# Patient Record
Sex: Male | Born: 1955 | Race: Black or African American | Hispanic: No | Marital: Single | Smoking: Never smoker
Health system: Southern US, Community
[De-identification: ages and names within clinical notes are randomized; demographics above are authoritative.]

## PROBLEM LIST (undated history)

## (undated) DIAGNOSIS — I10 Essential (primary) hypertension: Secondary | ICD-10-CM

## (undated) DIAGNOSIS — E119 Type 2 diabetes mellitus without complications: Secondary | ICD-10-CM

## (undated) HISTORY — PX: BELOW KNEE LEG AMPUTATION: SUR23

## (undated) HISTORY — PX: HERNIA REPAIR: SHX51

---

## 2011-08-21 ENCOUNTER — Encounter (INDEPENDENT_AMBULATORY_CARE_PROVIDER_SITE_OTHER): Payer: Medicaid Other | Admitting: Ophthalmology

## 2011-08-21 DIAGNOSIS — E11319 Type 2 diabetes mellitus with unspecified diabetic retinopathy without macular edema: Secondary | ICD-10-CM

## 2011-08-21 DIAGNOSIS — H43819 Vitreous degeneration, unspecified eye: Secondary | ICD-10-CM

## 2011-08-21 DIAGNOSIS — H2510 Age-related nuclear cataract, unspecified eye: Secondary | ICD-10-CM

## 2011-08-21 DIAGNOSIS — H35039 Hypertensive retinopathy, unspecified eye: Secondary | ICD-10-CM

## 2011-08-21 DIAGNOSIS — I10 Essential (primary) hypertension: Secondary | ICD-10-CM

## 2011-08-21 DIAGNOSIS — E1139 Type 2 diabetes mellitus with other diabetic ophthalmic complication: Secondary | ICD-10-CM

## 2012-08-21 ENCOUNTER — Ambulatory Visit (INDEPENDENT_AMBULATORY_CARE_PROVIDER_SITE_OTHER): Payer: Self-pay | Admitting: Ophthalmology

## 2012-10-01 ENCOUNTER — Ambulatory Visit (INDEPENDENT_AMBULATORY_CARE_PROVIDER_SITE_OTHER): Payer: Medicaid Other | Admitting: Ophthalmology

## 2012-10-01 DIAGNOSIS — E1139 Type 2 diabetes mellitus with other diabetic ophthalmic complication: Secondary | ICD-10-CM

## 2012-10-01 DIAGNOSIS — H2510 Age-related nuclear cataract, unspecified eye: Secondary | ICD-10-CM

## 2012-10-01 DIAGNOSIS — H35039 Hypertensive retinopathy, unspecified eye: Secondary | ICD-10-CM

## 2012-10-01 DIAGNOSIS — H43819 Vitreous degeneration, unspecified eye: Secondary | ICD-10-CM

## 2012-10-01 DIAGNOSIS — E11319 Type 2 diabetes mellitus with unspecified diabetic retinopathy without macular edema: Secondary | ICD-10-CM

## 2012-10-01 DIAGNOSIS — I10 Essential (primary) hypertension: Secondary | ICD-10-CM

## 2013-10-11 ENCOUNTER — Ambulatory Visit (INDEPENDENT_AMBULATORY_CARE_PROVIDER_SITE_OTHER): Payer: Medicaid Other | Admitting: Ophthalmology

## 2013-10-27 ENCOUNTER — Ambulatory Visit (INDEPENDENT_AMBULATORY_CARE_PROVIDER_SITE_OTHER): Payer: Medicaid Other | Admitting: Ophthalmology

## 2013-10-27 DIAGNOSIS — H2513 Age-related nuclear cataract, bilateral: Secondary | ICD-10-CM

## 2013-10-27 DIAGNOSIS — H43813 Vitreous degeneration, bilateral: Secondary | ICD-10-CM

## 2013-10-27 DIAGNOSIS — I10 Essential (primary) hypertension: Secondary | ICD-10-CM

## 2013-10-27 DIAGNOSIS — H35033 Hypertensive retinopathy, bilateral: Secondary | ICD-10-CM

## 2013-10-27 DIAGNOSIS — E11319 Type 2 diabetes mellitus with unspecified diabetic retinopathy without macular edema: Secondary | ICD-10-CM

## 2014-10-14 ENCOUNTER — Emergency Department (HOSPITAL_BASED_OUTPATIENT_CLINIC_OR_DEPARTMENT_OTHER): Payer: No Typology Code available for payment source

## 2014-10-14 ENCOUNTER — Encounter (HOSPITAL_BASED_OUTPATIENT_CLINIC_OR_DEPARTMENT_OTHER): Payer: Self-pay | Admitting: Emergency Medicine

## 2014-10-14 ENCOUNTER — Emergency Department (HOSPITAL_BASED_OUTPATIENT_CLINIC_OR_DEPARTMENT_OTHER)
Admission: EM | Admit: 2014-10-14 | Discharge: 2014-10-14 | Disposition: A | Discharge status: Home / Self Care | Payer: No Typology Code available for payment source | Attending: Emergency Medicine | Admitting: Emergency Medicine

## 2014-10-14 DIAGNOSIS — Y9389 Activity, other specified: Secondary | ICD-10-CM | POA: Diagnosis not present

## 2014-10-14 DIAGNOSIS — I10 Essential (primary) hypertension: Secondary | ICD-10-CM | POA: Diagnosis not present

## 2014-10-14 DIAGNOSIS — S299XXA Unspecified injury of thorax, initial encounter: Secondary | ICD-10-CM | POA: Insufficient documentation

## 2014-10-14 DIAGNOSIS — Z79899 Other long term (current) drug therapy: Secondary | ICD-10-CM | POA: Insufficient documentation

## 2014-10-14 DIAGNOSIS — S4991XA Unspecified injury of right shoulder and upper arm, initial encounter: Secondary | ICD-10-CM | POA: Diagnosis not present

## 2014-10-14 DIAGNOSIS — Z880 Allergy status to penicillin: Secondary | ICD-10-CM | POA: Insufficient documentation

## 2014-10-14 DIAGNOSIS — E119 Type 2 diabetes mellitus without complications: Secondary | ICD-10-CM | POA: Insufficient documentation

## 2014-10-14 DIAGNOSIS — Y92410 Unspecified street and highway as the place of occurrence of the external cause: Secondary | ICD-10-CM | POA: Diagnosis not present

## 2014-10-14 DIAGNOSIS — Y998 Other external cause status: Secondary | ICD-10-CM | POA: Insufficient documentation

## 2014-10-14 DIAGNOSIS — S8991XA Unspecified injury of right lower leg, initial encounter: Secondary | ICD-10-CM | POA: Insufficient documentation

## 2014-10-14 DIAGNOSIS — Z7984 Long term (current) use of oral hypoglycemic drugs: Secondary | ICD-10-CM | POA: Diagnosis not present

## 2014-10-14 HISTORY — DX: Type 2 diabetes mellitus without complications: E11.9

## 2014-10-14 HISTORY — DX: Essential (primary) hypertension: I10

## 2014-10-14 MED ORDER — NAPROXEN 250 MG PO TABS
500.0000 mg | ORAL_TABLET | Freq: Once | ORAL | Status: AC
  Administered 2014-10-14: 500 mg via ORAL
  Filled 2014-10-14: qty 2

## 2014-10-14 MED ORDER — CYCLOBENZAPRINE HCL 10 MG PO TABS
10.0000 mg | ORAL_TABLET | Freq: Once | ORAL | Status: AC
  Administered 2014-10-14: 10 mg via ORAL
  Filled 2014-10-14: qty 1

## 2014-10-14 MED ORDER — NAPROXEN 375 MG PO TABS: 375.0000 mg | ORAL_TABLET | Freq: Two times a day (BID) | ORAL | Status: AC

## 2014-10-14 MED ORDER — CYCLOBENZAPRINE HCL 10 MG PO TABS: 10.0000 mg | ORAL_TABLET | Freq: Two times a day (BID) | ORAL | Status: AC | PRN

## 2014-10-14 NOTE — Discharge Instructions (Signed)

## 2014-10-14 NOTE — ED Notes (Signed)
Patient in XR.
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
## 2014-11-23 ENCOUNTER — Ambulatory Visit (INDEPENDENT_AMBULATORY_CARE_PROVIDER_SITE_OTHER): Payer: Medicaid Other | Admitting: Ophthalmology

## 2014-12-07 ENCOUNTER — Ambulatory Visit (INDEPENDENT_AMBULATORY_CARE_PROVIDER_SITE_OTHER): Payer: Medicaid Other | Admitting: Ophthalmology

## 2014-12-22 ENCOUNTER — Ambulatory Visit (INDEPENDENT_AMBULATORY_CARE_PROVIDER_SITE_OTHER): Payer: Medicaid Other | Admitting: Ophthalmology

## 2015-01-26 ENCOUNTER — Ambulatory Visit (INDEPENDENT_AMBULATORY_CARE_PROVIDER_SITE_OTHER): Payer: Medicaid Other | Admitting: Ophthalmology

## 2015-01-26 DIAGNOSIS — H43813 Vitreous degeneration, bilateral: Secondary | ICD-10-CM

## 2015-01-26 DIAGNOSIS — H2513 Age-related nuclear cataract, bilateral: Secondary | ICD-10-CM | POA: Diagnosis not present

## 2015-01-26 DIAGNOSIS — H35033 Hypertensive retinopathy, bilateral: Secondary | ICD-10-CM

## 2015-01-26 DIAGNOSIS — E113293 Type 2 diabetes mellitus with mild nonproliferative diabetic retinopathy without macular edema, bilateral: Secondary | ICD-10-CM | POA: Diagnosis not present

## 2015-01-26 DIAGNOSIS — E11319 Type 2 diabetes mellitus with unspecified diabetic retinopathy without macular edema: Secondary | ICD-10-CM

## 2015-01-26 DIAGNOSIS — I10 Essential (primary) hypertension: Secondary | ICD-10-CM

## 2016-01-26 ENCOUNTER — Ambulatory Visit (INDEPENDENT_AMBULATORY_CARE_PROVIDER_SITE_OTHER): Payer: Medicaid Other | Admitting: Ophthalmology

## 2016-03-14 ENCOUNTER — Ambulatory Visit (INDEPENDENT_AMBULATORY_CARE_PROVIDER_SITE_OTHER): Payer: Medicaid Other | Admitting: Ophthalmology

## 2020-08-16 ENCOUNTER — Encounter (HOSPITAL_BASED_OUTPATIENT_CLINIC_OR_DEPARTMENT_OTHER): Payer: Self-pay | Admitting: *Deleted

## 2020-08-16 ENCOUNTER — Emergency Department (HOSPITAL_BASED_OUTPATIENT_CLINIC_OR_DEPARTMENT_OTHER): Payer: Medicare Other

## 2020-08-16 ENCOUNTER — Emergency Department (HOSPITAL_BASED_OUTPATIENT_CLINIC_OR_DEPARTMENT_OTHER)
Admission: EM | Admit: 2020-08-16 | Discharge: 2020-08-16 | Disposition: A | Discharge status: Home / Self Care | Payer: Medicare Other | Attending: Emergency Medicine | Admitting: Emergency Medicine

## 2020-08-16 ENCOUNTER — Other Ambulatory Visit: Payer: Self-pay

## 2020-08-16 DIAGNOSIS — Z7984 Long term (current) use of oral hypoglycemic drugs: Secondary | ICD-10-CM | POA: Diagnosis not present

## 2020-08-16 DIAGNOSIS — E119 Type 2 diabetes mellitus without complications: Secondary | ICD-10-CM | POA: Insufficient documentation

## 2020-08-16 DIAGNOSIS — S0292XA Unspecified fracture of facial bones, initial encounter for closed fracture: Secondary | ICD-10-CM

## 2020-08-16 DIAGNOSIS — Z79899 Other long term (current) drug therapy: Secondary | ICD-10-CM | POA: Diagnosis not present

## 2020-08-16 DIAGNOSIS — W19XXXA Unspecified fall, initial encounter: Secondary | ICD-10-CM

## 2020-08-16 DIAGNOSIS — W01198A Fall on same level from slipping, tripping and stumbling with subsequent striking against other object, initial encounter: Secondary | ICD-10-CM | POA: Insufficient documentation

## 2020-08-16 DIAGNOSIS — S0081XA Abrasion of other part of head, initial encounter: Secondary | ICD-10-CM

## 2020-08-16 DIAGNOSIS — I10 Essential (primary) hypertension: Secondary | ICD-10-CM | POA: Insufficient documentation

## 2020-08-16 DIAGNOSIS — Z7901 Long term (current) use of anticoagulants: Secondary | ICD-10-CM | POA: Diagnosis not present

## 2020-08-16 DIAGNOSIS — S02841A Fracture of lateral orbital wall, right side, initial encounter for closed fracture: Secondary | ICD-10-CM | POA: Diagnosis not present

## 2020-08-16 DIAGNOSIS — S0993XA Unspecified injury of face, initial encounter: Secondary | ICD-10-CM | POA: Diagnosis present

## 2020-08-16 MED ORDER — HYDROCODONE-ACETAMINOPHEN 5-325 MG PO TABS: 1.0000 | ORAL_TABLET | ORAL | 0 refills | Status: AC | PRN

## 2020-08-16 NOTE — ED Triage Notes (Signed)
Pt reports fall x 1 day ago from standing hitting head on pavement, lac to eyebrow and abrasion to right temple area, redness to outer corner of right eye , pt is on xarelto

## 2020-08-16 NOTE — ED Notes (Signed)
Patient transported to CT 

## 2020-08-16 NOTE — Discharge Instructions (Addendum)
Follow up with Facial surgeon for recheck

## 2020-08-16 NOTE — ED Provider Notes (Signed)
[facility] EMERGENCY DEPARTMENT Provider Note   CSN: [id number] Arrival date & time: 08/16/20  1659     History Chief Complaint  Patient presents with   Head Injury    Nathaniel Mccullough is a 65 y.o. male.  Pt fell yesterday and hit his face.  Pt saw his MD today and was told to come in.  Pt is on xarelto.  Pt reports no loc   The history is provided by the patient. No language interpreter was used.  Head Injury Location:  Frontal Time since incident:  1 day Mechanism of injury: fall   Fall:    Point of impact:  Face   Entrapped after fall: no   Pain details:    Radiates to:  Face   Severity:  Moderate   Timing:  Constant Relieved by:  Nothing Worsened by:  Nothing Ineffective treatments:  None tried Associated symptoms: no blurred vision, no focal weakness, no headaches and no nausea       Past Medical History:  Diagnosis Date   Diabetes mellitus without complication (HCC)    Hypertension     There are no problems to display for this patient.   Past Surgical History:  Procedure Laterality Date   BELOW KNEE LEG AMPUTATION     HERNIA REPAIR         No family history on file.  Social History   Tobacco Use   Smoking status: Never  Substance Use Topics   Alcohol use: No    Home Medications Prior to Admission medications   Medication Sig Start Date End Date Taking? Authorizing Provider  HYDROcodone-acetaminophen (NORCO/VICODIN) 5-325 MG tablet Take 1 tablet by mouth every 4 (four) hours as needed for moderate pain. 08/16/20 08/16/21 Yes Cheron Schaumann K, PA-C  cyclobenzaprine (FLEXERIL) 10 MG tablet Take 1 tablet (10 mg total) by mouth 2 (two) times daily as needed for muscle spasms. 10/14/14   Gwyneth Sprout, MD  lisinopril (PRINIVIL,ZESTRIL) 40 MG tablet Take 40 mg by mouth daily.    [provider]  metFORMIN (GLUCOPHAGE) 1000 MG tablet Take 500 mg by mouth 2 (two) times daily with a meal.    [provider]  naproxen  (NAPROSYN) 375 MG tablet Take 1 tablet (375 mg total) by mouth 2 (two) times daily. 10/14/14   Gwyneth Sprout, MD    Allergies    Iodine, Penicillins, and Shellfish allergy  Review of Systems   Review of Systems  Eyes:  Negative for blurred vision.  Gastrointestinal:  Negative for nausea.  Skin:  Positive for wound.  Neurological:  Negative for focal weakness and headaches.  All other systems reviewed and are negative.  Physical Exam Updated Vital Signs BP (!) 201/90   Pulse (!) 51   Temp 98.6 F (37 C) (Oral)   Resp 20   Ht 5\' 10"  (1.778 m)   Wt 65.8 kg   SpO2 96%   BMI 20.81 kg/m   Physical Exam Vitals and nursing note reviewed.  Constitutional:      Appearance: He is well-developed.  HENT:     Head: Normocephalic.     Comments: Abrasion right forehead, abrasion right eyebrow     Nose: Nose normal.  Eyes:     Extraocular Movements: Extraocular movements intact.     Conjunctiva/sclera: Conjunctivae normal.     Pupils: Pupils are equal, round, and reactive to light.     Comments: Subconjunctival hemorrhage right eye. EOMI  no entrapment  Cardiovascular:  Rate and Rhythm: Normal rate.  Pulmonary:     Effort: Pulmonary effort is normal.  Abdominal:     General: There is no distension.  Musculoskeletal:        General: Normal range of motion.     Cervical back: Normal range of motion.  Neurological:     General: No focal deficit present.     Mental Status: He is alert and oriented to person, place, and time. Mental status is at baseline.  Psychiatric:        Mood and Affect: Mood normal.    ED Results / Procedures / Treatments   Labs (all labs ordered are listed, but only abnormal results are displayed) Labs Reviewed - No data to display  EKG None  Radiology CT HEAD WO CONTRAST ( )  Result Date: 08/16/2020 CLINICAL DATA:  Head trauma EXAM: CT HEAD WITHOUT CONTRAST TECHNIQUE: Contiguous axial images were obtained from the base of the skull through  the vertex without intravenous contrast. COMPARISON:  None. FINDINGS: Brain: No acute territorial infarction, hemorrhage or intracranial mass. Mild atrophy. Nonenlarged ventricles Vascular: No hyperdense vessels. Scattered carotid vascular calcification Skull: No depressed skull fracture. Sinuses/Orbits: Acute nondisplaced right lateral orbital wall fracture. Findings suspicious for nondisplaced right orbital roof fracture on coronal images. Acute mildly displaced right zygomatic arch fracture. Incompletely visualized mildly comminuted fractures involving the right anterior and posterolateral wall of the maxillary sinus. Acute fluid level in the right maxillary sinus. Lobulated mucosal thickening left maxillary sinus and sphenoid sinuses. Other: Right facial and forehead soft tissue swelling IMPRESSION: 1. No definite CT evidence for acute intracranial abnormality. Atrophy 2. Multiple right facial bone fractures incompletely visualized. There are fractures involving the right orbit, right zygomatic arch, and right maxillary sinus. Electronically Signed   By: Jasmine Pang M.D.   On: 08/16/2020 17:40    Procedures Procedures   Medications Ordered in ED Medications - No data to display  ED Course  I have reviewed the triage vital signs and the nursing notes.  Pertinent labs & imaging results that were available during my care of the patient were reviewed by me and considered in my medical decision making (see chart for details).    MDM Rules/Calculators/A&P                           MDM:  Pt counseled on results.  Pt given rx for hydrocodone  Pt advised to follow up with Ent for recheck in 1 week  Final Clinical Impression(s) / ED Diagnoses Final diagnoses:  Facial fracture due to fall, closed, initial encounter (HCC)  Facial abrasion, initial encounter    Rx / DC Orders ED Discharge Orders          Ordered    HYDROcodone-acetaminophen (NORCO/VICODIN) 5-325 MG tablet  Every 4 hours PRN         08/16/20 1849          An After Visit Summary was printed and given to the patient.    Osie Cheeks 08/16/20 2315    Cheryll Cockayne, MD 08/19/20 [phone]
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
## 2020-08-16 NOTE — ED Notes (Signed)
Pt teaching provided on medications that may cause drowsiness. Pt instructed not to drive or operate heavy machinery while taking the prescribed medication. Pt verbalized understanding.  ? ?Pt provided discharge instructions and prescription information. Pt was given the opportunity to ask questions and questions were answered. Discharge signature not obtained in the setting of the COVID-19 pandemic in order to reduce high touch surfaces.  ? ?

## 2022-01-06 ENCOUNTER — Other Ambulatory Visit: Payer: Self-pay

## 2022-01-06 ENCOUNTER — Emergency Department (HOSPITAL_BASED_OUTPATIENT_CLINIC_OR_DEPARTMENT_OTHER)
Admission: EM | Admit: 2022-01-06 | Discharge: 2022-01-06 | Disposition: A | Discharge status: Home / Self Care | Payer: Medicare Other | Attending: Emergency Medicine | Admitting: Emergency Medicine

## 2022-01-06 ENCOUNTER — Encounter (HOSPITAL_BASED_OUTPATIENT_CLINIC_OR_DEPARTMENT_OTHER): Payer: Self-pay

## 2022-01-06 DIAGNOSIS — B0229 Other postherpetic nervous system involvement: Secondary | ICD-10-CM | POA: Insufficient documentation

## 2022-01-06 MED ORDER — HYDROCODONE-ACETAMINOPHEN 5-325 MG PO TABS: 2.0000 | ORAL_TABLET | ORAL | 0 refills | Status: DC | PRN

## 2022-01-06 NOTE — Discharge Instructions (Addendum)
Contact a health care provider if: Your medicine is not helping. You are struggling to manage your pain at home. Get help right away if: You have thoughts about hurting yourself or others. If you ever feel like you may hurt yourself or others, or have thoughts about taking your own life, get help right away. Go to your nearest emergency department or: Call your local emergency services (911 in the [place] Call a suicide crisis helpline, such as the National Suicide Prevention Lifeline at 9548155501 or 988 in the [place] This is open 24 hours a day in the [place] Text the Crisis Text Line at 5743653745 (in the [place]

## 2022-01-06 NOTE — ED Provider Notes (Signed)
[facility] EMERGENCY DEPARTMENT Provider Note   CSN: [id number] Arrival date & time: 01/06/22  2024     History  Chief Complaint  Patient presents with   Herpes Zoster    Nathaniel Mccullough is a 66 y.o. male nerve pain. Recent dx shingles. Now having post herpetic neurlagia. States " I just want to get some sleep. Plans to see PCP later this week. Deneis si/sx cellulitits  HPI     Home Medications Prior to Admission medications   Medication Sig Start Date End Date Taking? Authorizing Provider  cyclobenzaprine (FLEXERIL) 10 MG tablet Take 1 tablet (10 mg total) by mouth 2 (two) times daily as needed for muscle spasms. 10/14/14   Gwyneth Sprout, MD  lisinopril (PRINIVIL,ZESTRIL) 40 MG tablet Take 40 mg by mouth daily.    [provider]  metFORMIN (GLUCOPHAGE) 1000 MG tablet Take 500 mg by mouth 2 (two) times daily with a meal.    [provider]  naproxen (NAPROSYN) 375 MG tablet Take 1 tablet (375 mg total) by mouth 2 (two) times daily. 10/14/14   Gwyneth Sprout, MD      Allergies    Iodine, Penicillins, and Shellfish allergy    Review of Systems   Review of Systems  Physical Exam Updated Vital Signs BP (!) 211/75 (BP Location: Left Arm)   Pulse 61   Temp 98.2 F (36.8 C) (Oral)   Resp 14   Ht 5\' 9"  (1.753 m)   Wt 59 kg   SpO2 100%   BMI 19.20 kg/m  Physical Exam Vitals and nursing note reviewed.  Constitutional:      General: He is not in acute distress.    Appearance: He is well-developed. He is not diaphoretic.  HENT:     Head: Normocephalic and atraumatic.  Eyes:     General: No scleral icterus.    Conjunctiva/sclera: Conjunctivae normal.  Cardiovascular:     Rate and Rhythm: Normal rate and regular rhythm.     Heart sounds: Normal heart sounds.  Pulmonary:     Effort: Pulmonary effort is normal. No respiratory distress.     Breath sounds: Normal breath sounds.  Abdominal:     Palpations: Abdomen is soft.      Tenderness: There is no abdominal tenderness.  Musculoskeletal:     Cervical back: Normal range of motion and neck supple.  Skin:    General: Skin is warm and dry.  Neurological:     Mental Status: He is alert.  Psychiatric:        Behavior: Behavior normal.     ED Results / Procedures / Treatments   Labs (all labs ordered are listed, but only abnormal results are displayed) Labs Reviewed - No data to display  EKG None  Radiology No results found.  Procedures Procedures    Medications Ordered in ED Medications - No data to display  ED Course/ Medical Decision Making/ A&P                           Medical Decision Making PDMP reviewed during this encounter. Patient here with postherpetic neuralgia.  Blood pressure is elevated.  I he thinks is likely due to his pain level.  He takes blood pressure medications.  He has no chest pain or shortness of breath.  Patient is advised that he will need to follow closely with his primary care physician.  I have discharge patient with a very short  course of Norco.  He understands risks of taking an opiate medication.  He presented was appropriate for discharge without evidence of secondary cellulitis.  Risk Prescription drug management.           Final Clinical Impression(s) / ED Diagnoses Final diagnoses:  None    Rx / DC Orders ED Discharge Orders     None         Arthor Captain, PA-C 01/06/22 2330    Mesner, Barbara Cower, MD 01/08/22 [phone]

## 2022-01-06 NOTE — ED Triage Notes (Signed)
Pt c/o pain from shingles on back. States he was diagnosed with shingles 3 weeks ago, pt states he took the meds that were prescribed, but is still having pain.

## 2022-01-07 ENCOUNTER — Telehealth (HOSPITAL_BASED_OUTPATIENT_CLINIC_OR_DEPARTMENT_OTHER): Payer: Self-pay | Admitting: Emergency Medicine

## 2022-01-07 MED ORDER — HYDROCODONE-ACETAMINOPHEN 5-325 MG PO TABS: 1.0000 | ORAL_TABLET | Freq: Four times a day (QID) | ORAL | 0 refills | Status: AC | PRN

## 2022-01-07 NOTE — Telephone Encounter (Signed)
I saw this patient yesterday evening for complaint of postherpetic neuralgia.  I ordered medication to his pharmacy for pain and verified throughput however pharmacy states that they did not receive it.  I am sending it a second time.

## 2022-10-23 IMAGING — CT CT HEAD W/O CM
3 series · 15 of 47 positions shown, 18 images · non-contrast
Comparison: None.

CLINICAL DATA: Head trauma

EXAM:
CT HEAD WITHOUT CONTRAST
TECHNIQUE: Contiguous axial images were obtained from the base of the skull
through the vertex without intravenous contrast.

[Series 2: head wo · axial · 0.42mm/px · z∈[+982,+1112]mm · 9 of 32 slices shown, 12 images]
[im 3/32  brain]
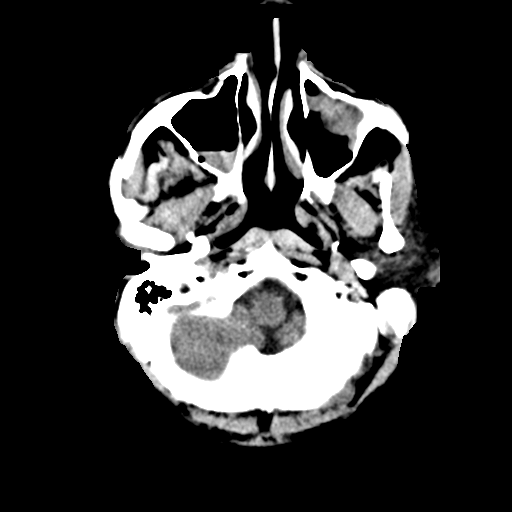
[im 3/32  bone]
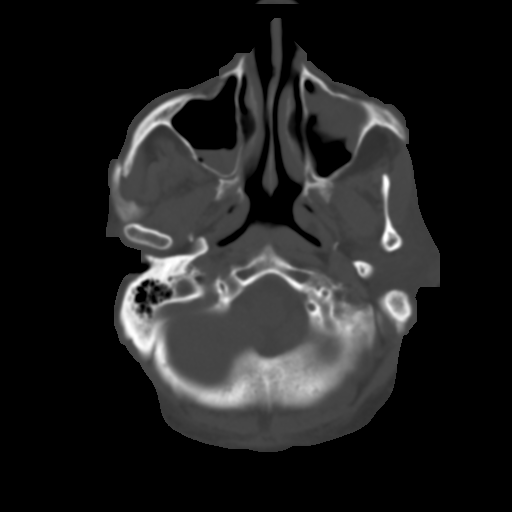
[im 6/32  brain]
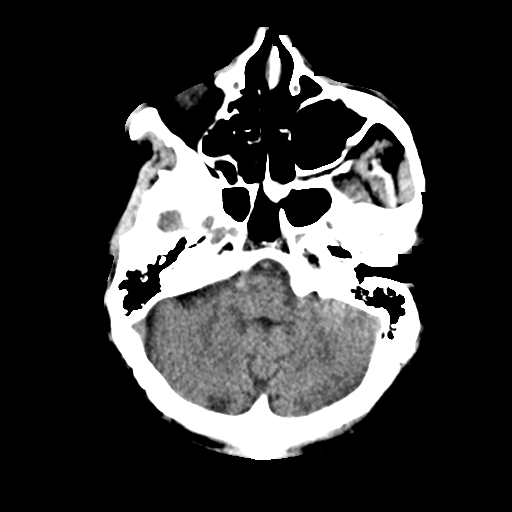
[im 9/32  brain]
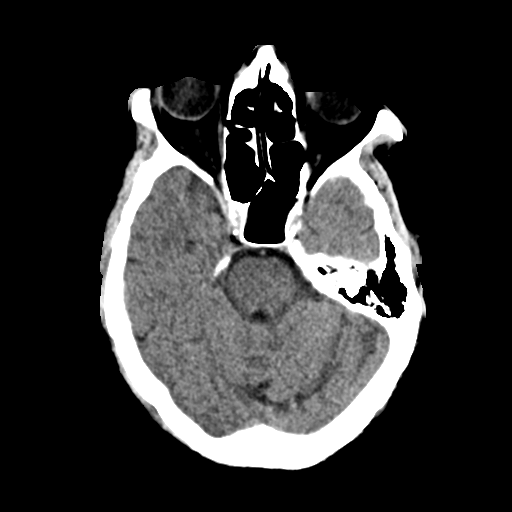
[im 12/32  brain]
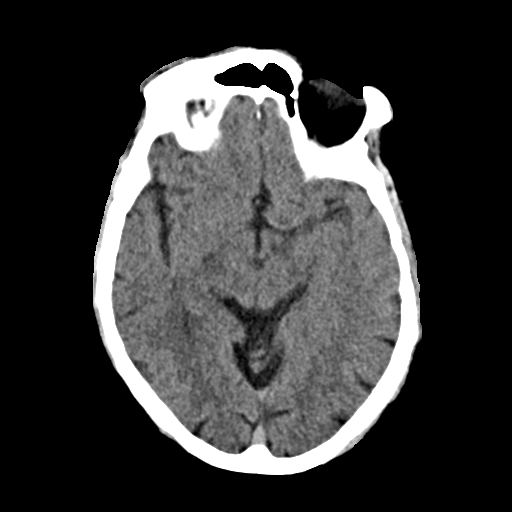
[im 17/32  brain]
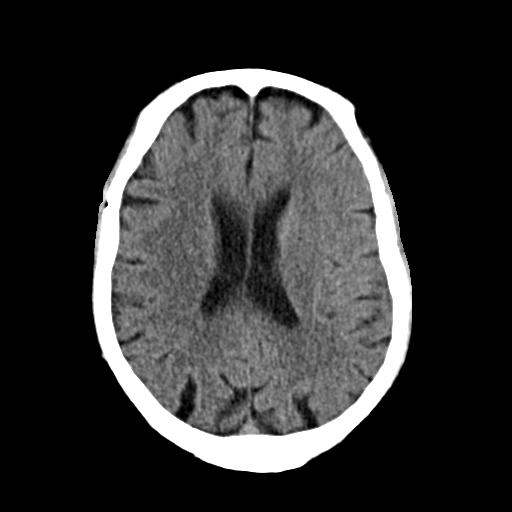
[im 17/32  bone]
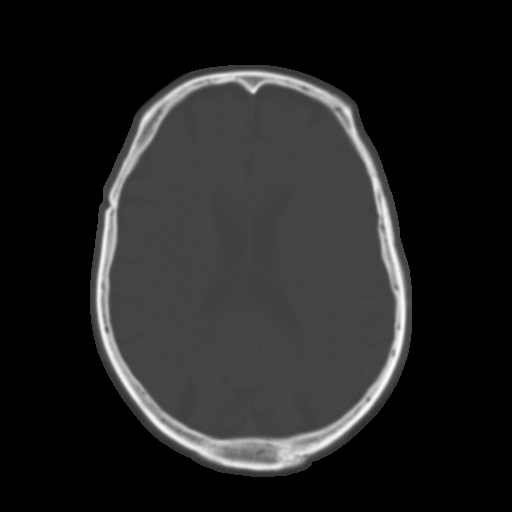
[im 20/32  brain]
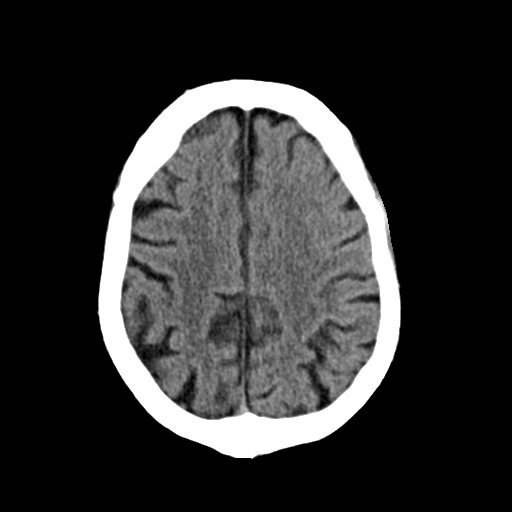
[im 23/32  brain]
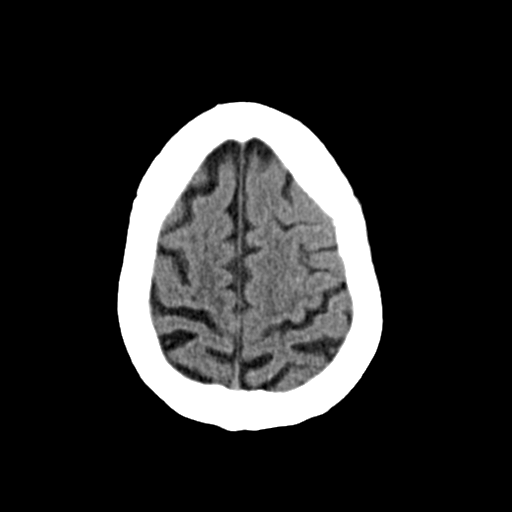
[im 26/32  brain]
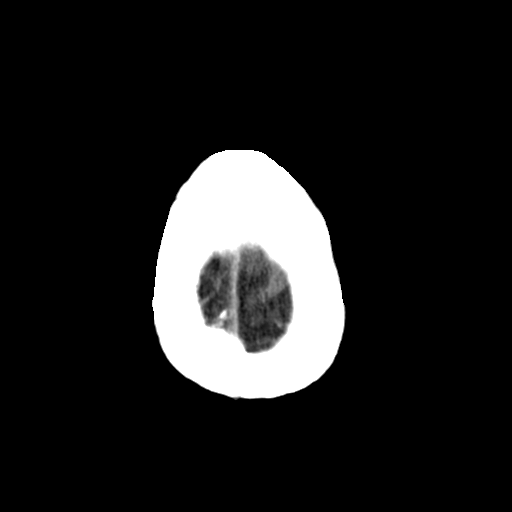
[im 29/32  brain]
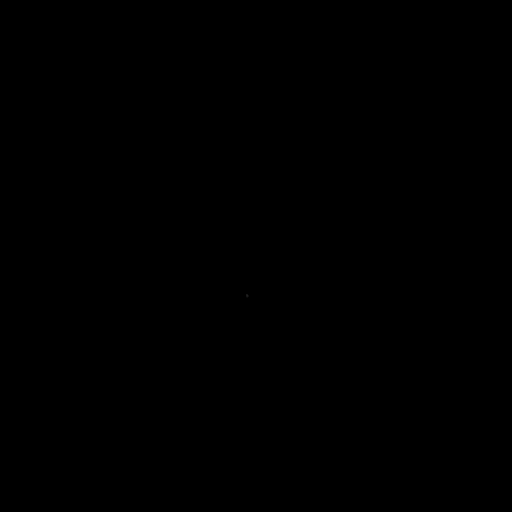
[im 29/32  bone]
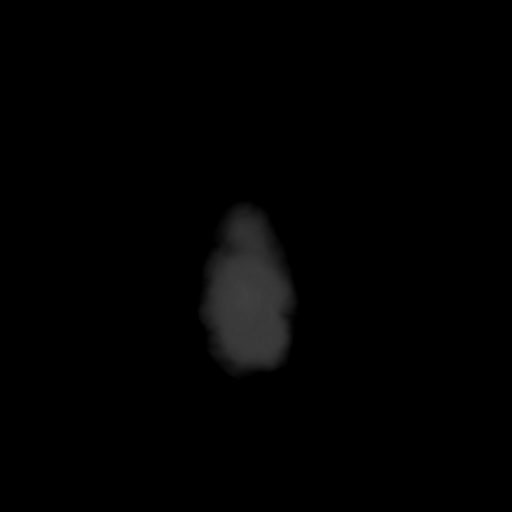

[Series 4: coronal soft · coronal · 0.31mm/px · 3 of 67 slices shown]
[im 23/67  brain]
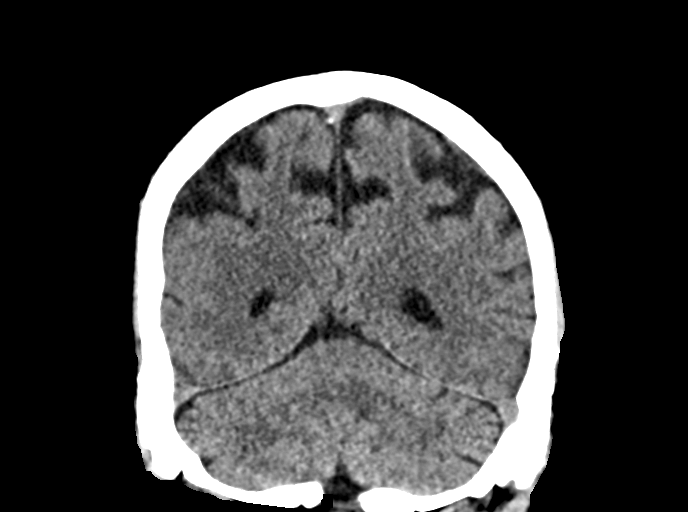
[im 30/67  brain]
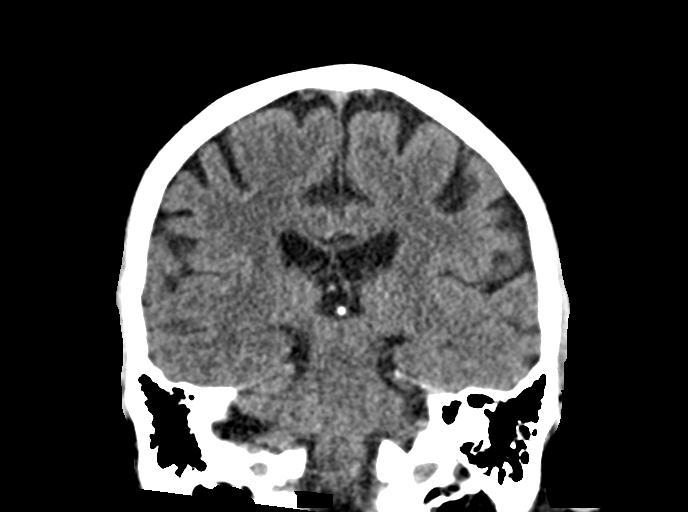
[im 37/67  brain]
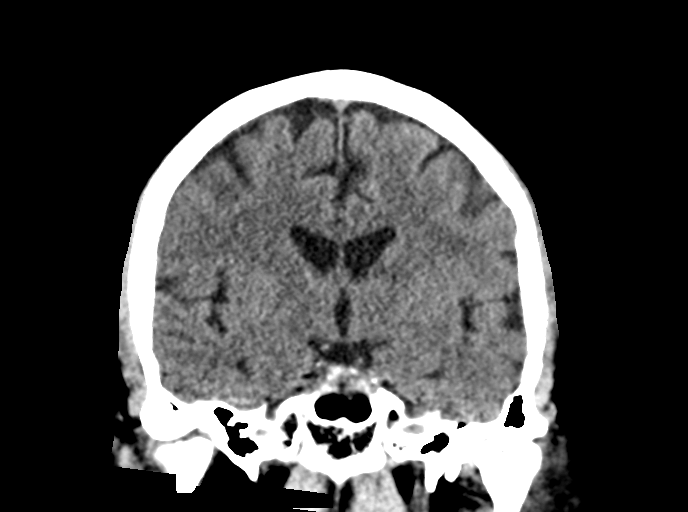

[Series 5: sag soft · sagittal · 0.31mm/px · 3 of 67 slices shown]
[im 26/67  brain]
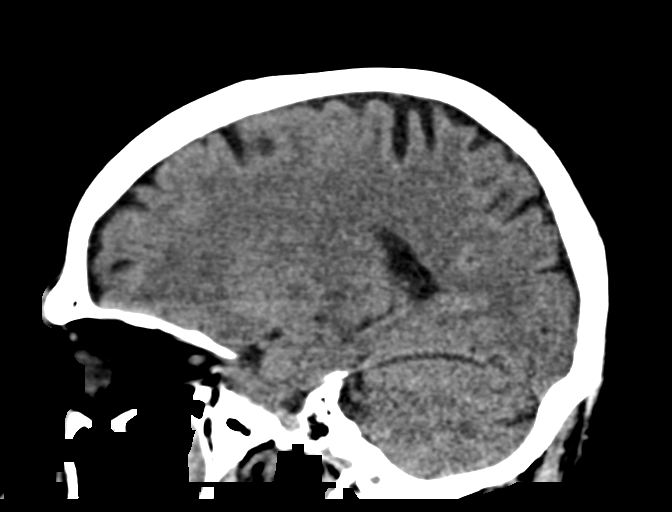
[im 34/67  brain]
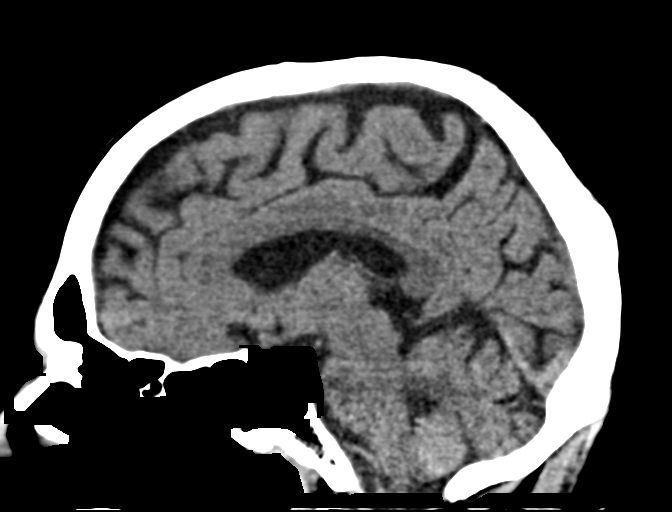
[im 42/67  brain]
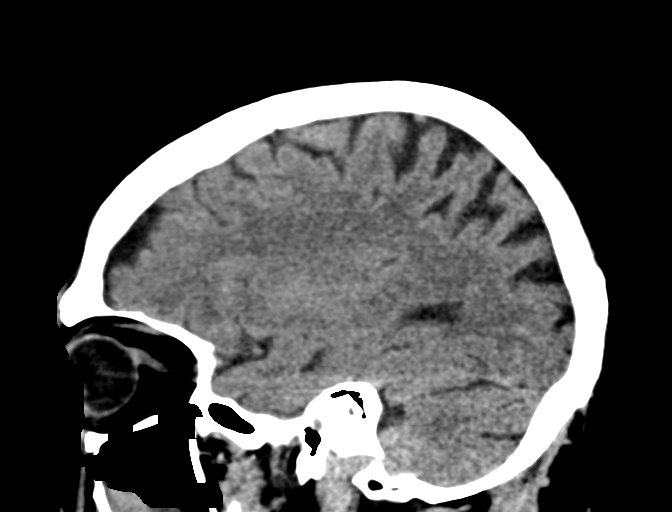

[15 of 47 positions shown; findings below may reference images not displayed]

FINDINGS: Brain: No acute territorial infarction, hemorrhage or intracranial
mass. Mild atrophy. Nonenlarged ventricles

Vascular: No hyperdense vessels. Scattered carotid vascular
calcification

Skull: No depressed skull fracture.

Sinuses/Orbits: Acute nondisplaced right lateral orbital wall
fracture. Findings suspicious for nondisplaced right orbital roof
fracture on coronal images. Acute mildly displaced right zygomatic
arch fracture. Incompletely visualized mildly comminuted fractures
involving the right anterior and posterolateral wall of the
maxillary sinus. Acute fluid level in the right maxillary sinus.
Lobulated mucosal thickening left maxillary sinus and sphenoid
sinuses.

Other: Right facial and forehead soft tissue swelling
IMPRESSION: 1. No definite CT evidence for acute intracranial abnormality.
Atrophy
2. Multiple right facial bone fractures incompletely visualized.
There are fractures involving the right orbit, right zygomatic arch,
and right maxillary sinus.

## 2023-02-03 ENCOUNTER — Emergency Department (HOSPITAL_BASED_OUTPATIENT_CLINIC_OR_DEPARTMENT_OTHER)
Admission: EM | Admit: 2023-02-03 | Discharge: 2023-02-03 | Disposition: A | Discharge status: Home / Self Care | Payer: Medicare HMO | Attending: Emergency Medicine | Admitting: Emergency Medicine

## 2023-02-03 ENCOUNTER — Emergency Department (HOSPITAL_BASED_OUTPATIENT_CLINIC_OR_DEPARTMENT_OTHER): Payer: Medicare HMO

## 2023-02-03 ENCOUNTER — Encounter (HOSPITAL_BASED_OUTPATIENT_CLINIC_OR_DEPARTMENT_OTHER): Payer: Self-pay | Admitting: Emergency Medicine

## 2023-02-03 ENCOUNTER — Other Ambulatory Visit: Payer: Self-pay

## 2023-02-03 DIAGNOSIS — I10 Essential (primary) hypertension: Secondary | ICD-10-CM | POA: Diagnosis not present

## 2023-02-03 DIAGNOSIS — Z20822 Contact with and (suspected) exposure to covid-19: Secondary | ICD-10-CM | POA: Insufficient documentation

## 2023-02-03 DIAGNOSIS — E119 Type 2 diabetes mellitus without complications: Secondary | ICD-10-CM | POA: Insufficient documentation

## 2023-02-03 DIAGNOSIS — J101 Influenza due to other identified influenza virus with other respiratory manifestations: Secondary | ICD-10-CM | POA: Diagnosis not present

## 2023-02-03 DIAGNOSIS — R059 Cough, unspecified: Secondary | ICD-10-CM | POA: Diagnosis present

## 2023-02-03 LAB — RESP PANEL BY RT-PCR (RSV, FLU A&B, COVID)  RVPGX2
Influenza A by PCR: POSITIVE — AB
Influenza B by PCR: NEGATIVE
Resp Syncytial Virus by PCR: NEGATIVE
SARS Coronavirus 2 by RT PCR: NEGATIVE

## 2023-02-03 MED ORDER — BENZONATATE 100 MG PO CAPS: 100.0000 mg | ORAL_CAPSULE | Freq: Three times a day (TID) | ORAL | 0 refills | Status: AC | PRN

## 2023-02-03 MED ORDER — FLUTICASONE PROPIONATE 50 MCG/ACT NA SUSP: 2.0000 | Freq: Every day | NASAL | 0 refills | Status: AC

## 2023-02-03 MED ORDER — ALBUTEROL SULFATE HFA 108 (90 BASE) MCG/ACT IN AERS: 1.0000 | INHALATION_SPRAY | Freq: Four times a day (QID) | RESPIRATORY_TRACT | 0 refills | Status: AC | PRN

## 2023-02-03 NOTE — ED Provider Notes (Signed)
Emergency Department Provider Note   I have reviewed the triage vital signs and the nursing notes.   HISTORY  Chief Complaint Cough   HPI Nathaniel Mccullough is a 68 y.o. male who presents to the emergency department valuation of feeling cold with cough, congestion, mildly productive cough.  Symptoms been ongoing for the past 1 to 2 weeks.  No fever.  Denies chest pain or specific shortness of breath.  No known sick contacts.  Has tried over-the-counter medications without significant relief in symptoms.   Past Medical History:  Diagnosis Date   Diabetes mellitus without complication (HCC)    Hypertension     Review of Systems  Constitutional: No fever/chills. Positive rhinorrhea.  Cardiovascular: Denies chest pain. Respiratory: Denies shortness of breath. Positive cough.  Gastrointestinal: No abdominal pain.  No nausea, no vomiting.   Skin: Negative for rash. Neurological: Negative for headaches.  ____________________________________________   PHYSICAL EXAM:  VITAL SIGNS: ED Triage Vitals  Encounter Vitals Group     BP 02/03/23 1135 (!) 171/98     Pulse Rate 02/03/23 1135 100     Resp 02/03/23 1135 19     Temp 02/03/23 1135 99.2 F (37.3 C)     Temp Source 02/03/23 1135 Oral     SpO2 02/03/23 1135 99 %     Weight 02/03/23 1132 147 lb (66.7 kg)     Height 02/03/23 1132 5\' 9"  (1.753 m)   Constitutional: Alert and oriented. Well appearing and in no acute distress. Eyes: Conjunctivae are normal.  Head: Atraumatic. Nose: Positive congestion/rhinnorhea. Mouth/Throat: Mucous membranes are moist.  Oropharynx non-erythematous. Neck: No stridor.   Cardiovascular: Normal rate, regular rhythm. Good peripheral circulation. Grossly normal heart sounds.   Respiratory: Normal respiratory effort.  No retractions. Lungs CTAB. Gastrointestinal: Soft and nontender. No distention.  Musculoskeletal: No gross deformities of extremities. Neurologic:  Normal speech and  language. Skin:  Skin is warm, dry and intact. No rash noted.  ____________________________________________   LABS (all labs ordered are listed, but only abnormal results are displayed)  Labs Reviewed  RESP PANEL BY RT-PCR (RSV, FLU A&B, COVID)  RVPGX2 - Abnormal; Notable for the following components:      Result Value   Influenza A by PCR POSITIVE (*)    All other components within normal limits   ____________________________________________   PROCEDURES  Procedure(s) performed:   Procedures  None  ____________________________________________   INITIAL IMPRESSION / ASSESSMENT AND PLAN / ED COURSE  Pertinent labs & imaging results that were available during my care of the patient were reviewed by me and considered in my medical decision making (see chart for details).   This patient is Presenting for Evaluation of cough, which does require a range of treatment options, and is a complaint that involves a moderate risk of morbidity and mortality.  The Differential Diagnoses include COVID, FLu, RSV, CAP, etc.   Clinical Laboratory Tests Ordered, included flu a positive.  Radiologic Tests Ordered, included CXR. I independently interpreted the images and agree with radiology interpretation.   Cardiac Monitor Tracing which shows NSR.    Social Determinants of Health Risk patient is a non-smoker.   Consult complete with  Medical Decision Making: Summary:  Patient presents to the emergency department for evaluation of cough, congestion, flulike symptoms.  Testing positive for flu a here.  No hypoxemia or increased work of breathing.  Lung sounds are clear.  No evidence of pneumonia on chest x-ray.  Patient outside the window to  benefit from Tamiflu.  Discussed continued supportive care at home with PCP follow-up as needed.   Patient's presentation is most consistent with acute, uncomplicated illness.   Disposition:  discharge  ____________________________________________  FINAL CLINICAL IMPRESSION(S) / ED DIAGNOSES  Final diagnoses:  Influenza A     NEW OUTPATIENT MEDICATIONS STARTED DURING THIS VISIT:  Discharge Medication List as of 02/03/2023 12:41 PM     START taking these medications   Details  albuterol (VENTOLIN HFA) 108 (90 Base) MCG/ACT inhaler Inhale 1-2 puffs into the lungs every 6 (six) hours as needed., Starting Mon 02/03/2023, Normal    benzonatate (TESSALON) 100 MG capsule Take 1 capsule (100 mg total) by mouth 3 (three) times daily as needed for cough., Starting Mon 02/03/2023, Normal    fluticasone (FLONASE) 50 MCG/ACT nasal spray Place 2 sprays into both nostrils daily for 14 days., Starting Mon 02/03/2023, Until Mon 02/17/2023, Normal        Note:  This document was prepared using Dragon voice recognition software and may include unintentional dictation errors.  Alona Bene, MD, [facility]    Fotios Amos, Arlyss Repress, MD 02/07/23 [phone]

## 2023-02-03 NOTE — ED Notes (Signed)
Patient transported to X-ray

## 2023-02-03 NOTE — ED Triage Notes (Signed)
Pt POV steady gait- c/o cold sx x 1-2weeks.  Pt reports chills, nasal drainage, productive cough.  Denies fever.

## 2023-02-03 NOTE — Discharge Instructions (Signed)
You were diagnosed with the flu (influenza).  You will feel ill for as much as a few weeks.  Please take any prescribed medications as instructed, and you may use over-the-counter Tylenol and/or ibuprofen as needed according to label instructions (unless you have an allergy to either or have been told by your doctor not to take them).  Follow up with your physician as instructed above, and return to the Emergency Department (ED) if you are unable to tolerate fluids due to vomiting, have worsening trouble breathing, become extremely tired or difficult to awaken, or if you develop any other symptoms that concern you.

## 2023-12-26 ENCOUNTER — Emergency Department (HOSPITAL_BASED_OUTPATIENT_CLINIC_OR_DEPARTMENT_OTHER)

## 2023-12-26 ENCOUNTER — Encounter (HOSPITAL_BASED_OUTPATIENT_CLINIC_OR_DEPARTMENT_OTHER): Payer: Self-pay | Admitting: Emergency Medicine

## 2023-12-26 ENCOUNTER — Emergency Department (HOSPITAL_BASED_OUTPATIENT_CLINIC_OR_DEPARTMENT_OTHER)
Admission: EM | Admit: 2023-12-26 | Discharge: 2023-12-26 | Disposition: A | Discharge status: Home / Self Care | Attending: Emergency Medicine | Admitting: Emergency Medicine

## 2023-12-26 ENCOUNTER — Other Ambulatory Visit: Payer: Self-pay

## 2023-12-26 DIAGNOSIS — Z7984 Long term (current) use of oral hypoglycemic drugs: Secondary | ICD-10-CM | POA: Diagnosis not present

## 2023-12-26 DIAGNOSIS — Z79899 Other long term (current) drug therapy: Secondary | ICD-10-CM | POA: Insufficient documentation

## 2023-12-26 DIAGNOSIS — N280 Ischemia and infarction of kidney: Secondary | ICD-10-CM | POA: Diagnosis not present

## 2023-12-26 DIAGNOSIS — E119 Type 2 diabetes mellitus without complications: Secondary | ICD-10-CM | POA: Insufficient documentation

## 2023-12-26 DIAGNOSIS — J40 Bronchitis, not specified as acute or chronic: Secondary | ICD-10-CM | POA: Diagnosis not present

## 2023-12-26 DIAGNOSIS — R059 Cough, unspecified: Secondary | ICD-10-CM | POA: Diagnosis present

## 2023-12-26 DIAGNOSIS — I10 Essential (primary) hypertension: Secondary | ICD-10-CM | POA: Diagnosis not present

## 2023-12-26 DIAGNOSIS — R0602 Shortness of breath: Secondary | ICD-10-CM | POA: Insufficient documentation

## 2023-12-26 LAB — CBC
HCT: 41.9 % (ref 39.0–52.0)
Hemoglobin: 13.7 g/dL (ref 13.0–17.0)
MCH: 30.6 pg (ref 26.0–34.0)
MCHC: 32.7 g/dL (ref 30.0–36.0)
MCV: 93.7 fL (ref 80.0–100.0)
Platelets: 167 K/uL (ref 150–400)
RBC: 4.47 MIL/uL (ref 4.22–5.81)
RDW: 14.4 % (ref 11.5–15.5)
WBC: 9.2 K/uL (ref 4.0–10.5)
nRBC: 0 % (ref 0.0–0.2)

## 2023-12-26 LAB — URINALYSIS, ROUTINE W REFLEX MICROSCOPIC
Bilirubin Urine: NEGATIVE
Glucose, UA: NEGATIVE mg/dL
Hgb urine dipstick: NEGATIVE
Ketones, ur: NEGATIVE mg/dL
Leukocytes,Ua: NEGATIVE
Nitrite: NEGATIVE
Protein, ur: >=300 mg/dL — AB
Specific Gravity, Urine: 1.02 (ref 1.005–1.030)
pH: 6.5 (ref 5.0–8.0)

## 2023-12-26 LAB — RESP PANEL BY RT-PCR (RSV, FLU A&B, COVID)  RVPGX2
Influenza A by PCR: NEGATIVE
Influenza B by PCR: NEGATIVE
Resp Syncytial Virus by PCR: NEGATIVE
SARS Coronavirus 2 by RT PCR: NEGATIVE

## 2023-12-26 LAB — COMPREHENSIVE METABOLIC PANEL WITH GFR
ALT: 11 U/L (ref 0–44)
AST: 22 U/L (ref 15–41)
Albumin: 3.8 g/dL (ref 3.5–5.0)
Alkaline Phosphatase: 105 U/L (ref 38–126)
Anion gap: 14 (ref 5–15)
BUN: 26 mg/dL — ABNORMAL HIGH (ref 8–23)
CO2: 21 mmol/L — ABNORMAL LOW (ref 22–32)
Calcium: 9.3 mg/dL (ref 8.9–10.3)
Chloride: 107 mmol/L (ref 98–111)
Creatinine, Ser: 1.52 mg/dL — ABNORMAL HIGH (ref 0.61–1.24)
GFR, Estimated: 49 mL/min — ABNORMAL LOW
Glucose, Bld: 150 mg/dL — ABNORMAL HIGH (ref 70–99)
Potassium: 4.6 mmol/L (ref 3.5–5.1)
Sodium: 142 mmol/L (ref 135–145)
Total Bilirubin: 0.3 mg/dL (ref 0.0–1.2)
Total Protein: 7 g/dL (ref 6.5–8.1)

## 2023-12-26 LAB — URINALYSIS, MICROSCOPIC (REFLEX): RBC / HPF: NONE SEEN RBC/hpf (ref 0–5)

## 2023-12-26 LAB — LIPASE, BLOOD: Lipase: 65 U/L — ABNORMAL HIGH (ref 11–51)

## 2023-12-26 MED ORDER — HYDROCODONE-ACETAMINOPHEN 5-325 MG PO TABS
1.0000 | ORAL_TABLET | Freq: Once | ORAL | Status: AC
  Administered 2023-12-26: 1 via ORAL
  Filled 2023-12-26: qty 1

## 2023-12-26 MED ORDER — PREDNISONE 20 MG PO TABS: 40.0000 mg | ORAL_TABLET | Freq: Every day | ORAL | 0 refills | Status: AC

## 2023-12-26 MED ORDER — IPRATROPIUM-ALBUTEROL 0.5-2.5 (3) MG/3ML IN SOLN
3.0000 mL | RESPIRATORY_TRACT | Status: AC
  Administered 2023-12-26: 3 mL via RESPIRATORY_TRACT
  Filled 2023-12-26: qty 3

## 2023-12-26 MED ORDER — ALBUTEROL SULFATE HFA 108 (90 BASE) MCG/ACT IN AERS
2.0000 | INHALATION_SPRAY | Freq: Once | RESPIRATORY_TRACT | Status: AC
  Administered 2023-12-26: 2 via RESPIRATORY_TRACT
  Filled 2023-12-26: qty 6.7

## 2023-12-26 MED ORDER — LIDOCAINE 5 % EX PTCH
1.0000 | MEDICATED_PATCH | CUTANEOUS | Status: DC
  Administered 2023-12-26: 1 via TRANSDERMAL
  Filled 2023-12-26: qty 1

## 2023-12-26 MED ORDER — METHYLPREDNISOLONE SODIUM SUCC 125 MG IJ SOLR
125.0000 mg | INTRAMUSCULAR | Status: AC
  Administered 2023-12-26: 125 mg via INTRAVENOUS
  Filled 2023-12-26: qty 2

## 2023-12-26 MED ORDER — DOXYCYCLINE HYCLATE 100 MG PO CAPS: 100.0000 mg | ORAL_CAPSULE | Freq: Two times a day (BID) | ORAL | 0 refills | Status: AC

## 2023-12-26 MED ORDER — IOHEXOL 350 MG/ML SOLN
100.0000 mL | Freq: Once | INTRAVENOUS | Status: AC | PRN
  Administered 2023-12-26: 100 mL via INTRAVENOUS

## 2023-12-26 NOTE — ED Provider Notes (Signed)
 " [facility] Provider Note   CSN: [id number] Arrival date & time: 12/26/23  1801     Patient presents with: Shortness of Breath and Abdominal Pain   Nathaniel Mccullough is a 68 y.o. male.  {Add pertinent medical, surgical, social history, OB history to HPI:2464} 68 year old male history of AAA, diabetes, hypertension, and PAD who presents to the emergency department with cough, congestion, and right-sided pain.  Symptoms started 5 days ago.  Had a productive cough.  No fevers.  No chills.  Says that his right side started hurting.  Worse with coughing.  Sick/10 severity.  Nonradiating.  Was very concerned about it potentially being due to his aneurysm.       Prior to Admission medications  Medication Sig Start Date End Date Taking? Authorizing Provider  albuterol  (VENTOLIN  HFA) 108 (90 Base) MCG/ACT inhaler Inhale 1-2 puffs into the lungs every 6 (six) hours as needed. 02/03/23   Long, Fonda MATSU, MD  benzonatate  (TESSALON ) 100 MG capsule Take 1 capsule (100 mg total) by mouth 3 (three) times daily as needed for cough. 02/03/23   Long, Fonda MATSU, MD  cyclobenzaprine  (FLEXERIL ) 10 MG tablet Take 1 tablet (10 mg total) by mouth 2 (two) times daily as needed for muscle spasms. 10/14/14   Doretha Folks, MD  fluticasone  (FLONASE ) 50 MCG/ACT nasal spray Place 2 sprays into both nostrils daily for 14 days. 02/03/23 02/17/23  LongFonda MATSU, MD  HYDROcodone -acetaminophen  (NORCO) 5-325 MG tablet Take 1-2 tablets by mouth every 6 (six) hours as needed for severe pain. 01/07/22   Harris, Abigail, PA-C  lisinopril (PRINIVIL,ZESTRIL) 40 MG tablet Take 40 mg by mouth daily.    [provider]  metFORMIN (GLUCOPHAGE) 1000 MG tablet Take 500 mg by mouth 2 (two) times daily with a meal.    [provider]  naproxen  (NAPROSYN ) 375 MG tablet Take 1 tablet (375 mg total) by mouth 2 (two) times daily. 10/14/14   Doretha Folks, MD    Allergies:  Iodine, Penicillins, and Shellfish allergy    Review of Systems  Updated Vital Signs BP (!) 184/71 (BP Location: Right Arm)   Pulse 69   Temp 98 F (36.7 C) (Oral)   Resp 18   Wt 66.7 kg   SpO2 93%   BMI 21.72 kg/m   Physical Exam Vitals and nursing note reviewed.  Constitutional:      General: He is not in acute distress.    Appearance: He is well-developed.  HENT:     Head: Normocephalic and atraumatic.     Right Ear: External ear normal.     Left Ear: External ear normal.     Nose: Nose normal.  Eyes:     Extraocular Movements: Extraocular movements intact.     Conjunctiva/sclera: Conjunctivae normal.     Pupils: Pupils are equal, round, and reactive to light.  Cardiovascular:     Rate and Rhythm: Normal rate and regular rhythm.     Heart sounds: Normal heart sounds.     Comments: Radial pulses 2+ bilaterally. Pulmonary:     Effort: Pulmonary effort is normal. No respiratory distress.     Breath sounds: Wheezing (Bilateral) present.  Abdominal:     General: There is no distension.     Palpations: Abdomen is soft. There is no mass.     Tenderness: There is abdominal tenderness (Right lower quadrant, right mid abdomen, right upper quad). There is no guarding.  Musculoskeletal:  Cervical back: Normal range of motion and neck supple.     Right lower leg: No edema.     Left lower leg: No edema.  Skin:    General: Skin is warm and dry.  Neurological:     Mental Status: He is alert. Mental status is at baseline.  Psychiatric:        Mood and Affect: Mood normal.        Behavior: Behavior normal.     (all labs ordered are listed, but only abnormal results are displayed) Labs Reviewed  RESP PANEL BY RT-PCR (RSV, FLU A&B, COVID)  RVPGX2  CBC  LIPASE, BLOOD  COMPREHENSIVE METABOLIC PANEL WITH GFR  URINALYSIS, ROUTINE W REFLEX MICROSCOPIC    EKG: EKG Interpretation Date/Time:  Friday December 26 2023 18:14:14 EST Ventricular Rate:  68 PR  Interval:  130 QRS Duration:  92 QT Interval:  433 QTC Calculation: 461 R Axis:   83  Text Interpretation: Sinus rhythm Right atrial enlargement Borderline right axis deviation LVH with secondary repolarization abnormality Anterior Q waves, possibly due to LVH ST depr, consider ischemia, inferior leads Nonspecific ST abnormality Confirmed by Yolande Charleston [phone]) on 12/26/2023 7:14:46 PM  Radiology: No results found.  {Document cardiac monitor, telemetry assessment procedure when appropriate:32947} Procedures   Medications Ordered in the ED - No data to display    {Click here for ABCD2, HEART and other calculators REFRESH Note before signing:1}                              Medical Decision Making Amount and/or Complexity of Data Reviewed Labs: ordered. Radiology: ordered.  Risk Prescription drug management.   ***  {Document critical care time when appropriate  Document review of labs and clinical decision tools ie CHADS2VASC2, etc  Document your independent review of radiology images and any outside records  Document your discussion with family members, caretakers and with consultants  Document social determinants of health affecting pt's care  Document your decision making why or why not admission, treatments were needed:32947:::1}   Final diagnoses:  None    ED Discharge Orders     None        "

## 2023-12-26 NOTE — ED Triage Notes (Addendum)
 Pt in with 4-5 days worth of cough and sob, reports flu-like symptoms; expiratory wheezes noted. Pt also states pain to RUQ, is afraid it's r/t his aortic aneurysm - was 2.5cm earlier this month. Pain non-radiating, stays in RUQ. Yellow/Drohan sputum reported w/cough.   184/71 - has not taken nighttime bp meds

## 2023-12-26 NOTE — ED Notes (Signed)
 Provider at the bedside.

## 2023-12-26 NOTE — Discharge Instructions (Addendum)
 You were seen for your bronchitis exacerbation in the emergency department.   At home, please use your inhalers for any wheezing.  Please take the steroids and doxycyline we have prescribed you for your bronchitis exacerbation as well.    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 Follow-up with the spine doctors about your T12 fracture.   Check your MyChart online for the results of any tests that had not resulted by the time you left the emergency department.   Follow-up with your primary doctor in 2-3 days regarding your visit.    Return immediately to the emergency department if you experience any of the following: Difficulty breathing, severe chest pain, or any other concerning symptoms.    Thank you for visiting our Emergency Department. It was a pleasure taking care of you today.

## 2023-12-26 NOTE — ED Notes (Signed)
 Pt. Reports he was driving thru [place] and started coughing and could not stop so he came to the ED.  Pt. [place] he feels better now that he has had a breathing treatment and just felt like he needed to be checked since he has  so many problems and wanted to make sure he didn't cause himself any other problems.  Pt. Is in no distress at this time.
